# Patient Record
Sex: Male | Born: 1960 | Race: White | Hispanic: No | Marital: Single | Smoking: Former smoker
Health system: Southern US, Community
[De-identification: ages and names within clinical notes are randomized; demographics above are authoritative.]

## PROBLEM LIST (undated history)

## (undated) DIAGNOSIS — R011 Cardiac murmur, unspecified: Secondary | ICD-10-CM

## (undated) DIAGNOSIS — R0600 Dyspnea, unspecified: Secondary | ICD-10-CM

## (undated) DIAGNOSIS — E785 Hyperlipidemia, unspecified: Secondary | ICD-10-CM

## (undated) DIAGNOSIS — I10 Essential (primary) hypertension: Secondary | ICD-10-CM

## (undated) DIAGNOSIS — R0609 Other forms of dyspnea: Secondary | ICD-10-CM

## (undated) DIAGNOSIS — I493 Ventricular premature depolarization: Principal | ICD-10-CM

## (undated) HISTORY — DX: Cardiac murmur, unspecified: R01.1

## (undated) HISTORY — DX: Ventricular premature depolarization: I49.3

## (undated) HISTORY — DX: Essential (primary) hypertension: I10

## (undated) HISTORY — DX: Dyspnea, unspecified: R06.00

## (undated) HISTORY — DX: Other forms of dyspnea: R06.09

## (undated) HISTORY — DX: Hyperlipidemia, unspecified: E78.5

---

## 1999-02-05 ENCOUNTER — Emergency Department (HOSPITAL_COMMUNITY): Admission: EM | Admit: 1999-02-05 | Discharge: 1999-02-06 | Payer: Self-pay | Admitting: Emergency Medicine

## 1999-02-06 ENCOUNTER — Encounter: Payer: Self-pay | Admitting: Emergency Medicine

## 2000-03-12 ENCOUNTER — Encounter: Payer: Self-pay | Admitting: Orthopedic Surgery

## 2000-03-12 ENCOUNTER — Ambulatory Visit (HOSPITAL_COMMUNITY): Admission: RE | Admit: 2000-03-12 | Discharge: 2000-03-12 | Payer: Self-pay | Admitting: Orthopedic Surgery

## 2000-06-08 ENCOUNTER — Emergency Department (HOSPITAL_COMMUNITY): Admission: EM | Admit: 2000-06-08 | Discharge: 2000-06-08 | Payer: Self-pay | Admitting: Emergency Medicine

## 2000-06-10 ENCOUNTER — Emergency Department (HOSPITAL_COMMUNITY): Admission: EM | Admit: 2000-06-10 | Discharge: 2000-06-10 | Payer: Self-pay | Admitting: Emergency Medicine

## 2000-06-22 ENCOUNTER — Ambulatory Visit (HOSPITAL_BASED_OUTPATIENT_CLINIC_OR_DEPARTMENT_OTHER): Admission: RE | Admit: 2000-06-22 | Discharge: 2000-06-22 | Payer: Self-pay | Admitting: Otolaryngology

## 2010-01-03 ENCOUNTER — Encounter: Admission: RE | Admit: 2010-01-03 | Discharge: 2010-01-03 | Payer: Self-pay | Admitting: Family Medicine

## 2010-11-01 NOTE — Op Note (Signed)
[name]  Patient:    Ruben Rose, Ruben Rose                      MRN: [id number] Proc. Date: 06/22/00 Adm. Date:  [id number] Disc. 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  OPERATION PERFORMED: 1. Nasal septoplasty. 2. Submucous resection of inferior turbinates, bilateral.  SURGEON:  Jefry H. Pollyann Kennedy, M.D.  ANESTHESIA:  General endotracheal.  COMPLICATIONS:  None.  ESTIMATED BLOOD LOSS:  50 cc.  FINDINGS:  Severe deviation of the septum in all areas with an S-shaped deformity.  There was a large bony spur inferiorly and anteriorly down the right side, a severe caudal deflection towards the left and a posterior-superior deflection towards the right side.  There was also severe thickening of the maxillary crest with a large spur midway back on the right on the left side.  The inferior turbinates were very large with large bony hypertrophy.  The patient tolerated the procedure well was awakened and extubated and transferred to recovery in stable condition.  INDICATIONS FOR PROCEDURE:  This is a 50 year old with chronic nasal obstruction and loud snoring.  The risks, benefits, alternatives and complications of the procedure were explained to the patient, who seemed to understand and agreed to surgery.  DESCRIPTION OF PROCEDURE:  The patient was taken to the operating room and placed on the operating table in the supine position.  Following induction of general endotracheal anesthesia, the patient was prepped and draped in standard fashion.  Oxymetazoline spray was used preoperatively in the nose. 1% Xylocaine with epinephrine was infiltrated into the septum, the columella, and the inferior turbinates bilaterally.  A total of  approximately 5 cc was used.  Afrin-soaked pledgets were then placed bilaterally in the nasal cavities.  1 - Nasal septoplasty.  A left hemitransfixion incision was used with a 15 scalpel to ____________ the septal cartilage and mucoperichondrial flap was developed posteriorly down the left side.  The bony cartilaginous junction was divided and a flap was developed down right side as well.  A superior attachment of the ethmoid plate was taken down with a fenestrated Jansen-Middleton rongeur.  The posterior attachments which were quite thickened were divided using Therapist, nutritional and a large fragment of ethmoid plate was removed with Lenoria Chime forceps.  A large fragment of vomerian bone was taken down with a closed Jansen-Middleton rongeur.  The caudal septum was elongated and the posterior one third of the quadrangular cartilage was resected.  The caudal most attachment to the maxillary spine was kept intact. The posterior aspect of the maxillary crest was taken down on both sides wtih a 4 mm osteotome.  A mucosal incision was created on the right side as well just in front of a large maxillary crest spur and the mucosa was elevated off the crest and a large fragment of bone was taken down with a chisel.  All of these maneuvers greatly enhanced the ability of the septum to lie flat in the midline and enhanced the nasal airways bilaterally.  The mucosal incision was reapproximated with 4-0 chromic suture.  The septal flaps were quilted with 4-0 plain gut.  There were tears created bilaterally but  the mucosa was nicely reapposed with the quilting suture.  0.030 Silastic sheeting was cut to size and shape and coated with bacitracin ointment and was applied on either side of the septum as well, secured in place with a nylon suture.  2 - Submucous resection of inferior turbinates.  The leading edge of the inferior turbinates was incised vertically with a 15 scalpel.  The mucosa was elevated  off the turbinate bones on all three sides and a large fragment of bone was resected  bilaterally with Lenoria Chime forceps.  The turbinate remnants were outfractured with a Therapist, nutritional.  The nasal cavities were suctioned of blood and secretions and packed with rolled up Telfa gauze coated with Bacitracin ointment.  The pharynx was suctioned under direct visualization as well.  The patient was then awakened, extubated and transferred to recovery. DD:  06/22/00 TD:  06/22/00 Job: [id number]

## 2011-06-20 IMAGING — CR DG CHEST 2V
2 series · 2 of 2 positions shown · non-contrast
Comparison: None

CLINICAL DATA: Chest pain.

CHEST - 2 VIEW

[w chest pa]
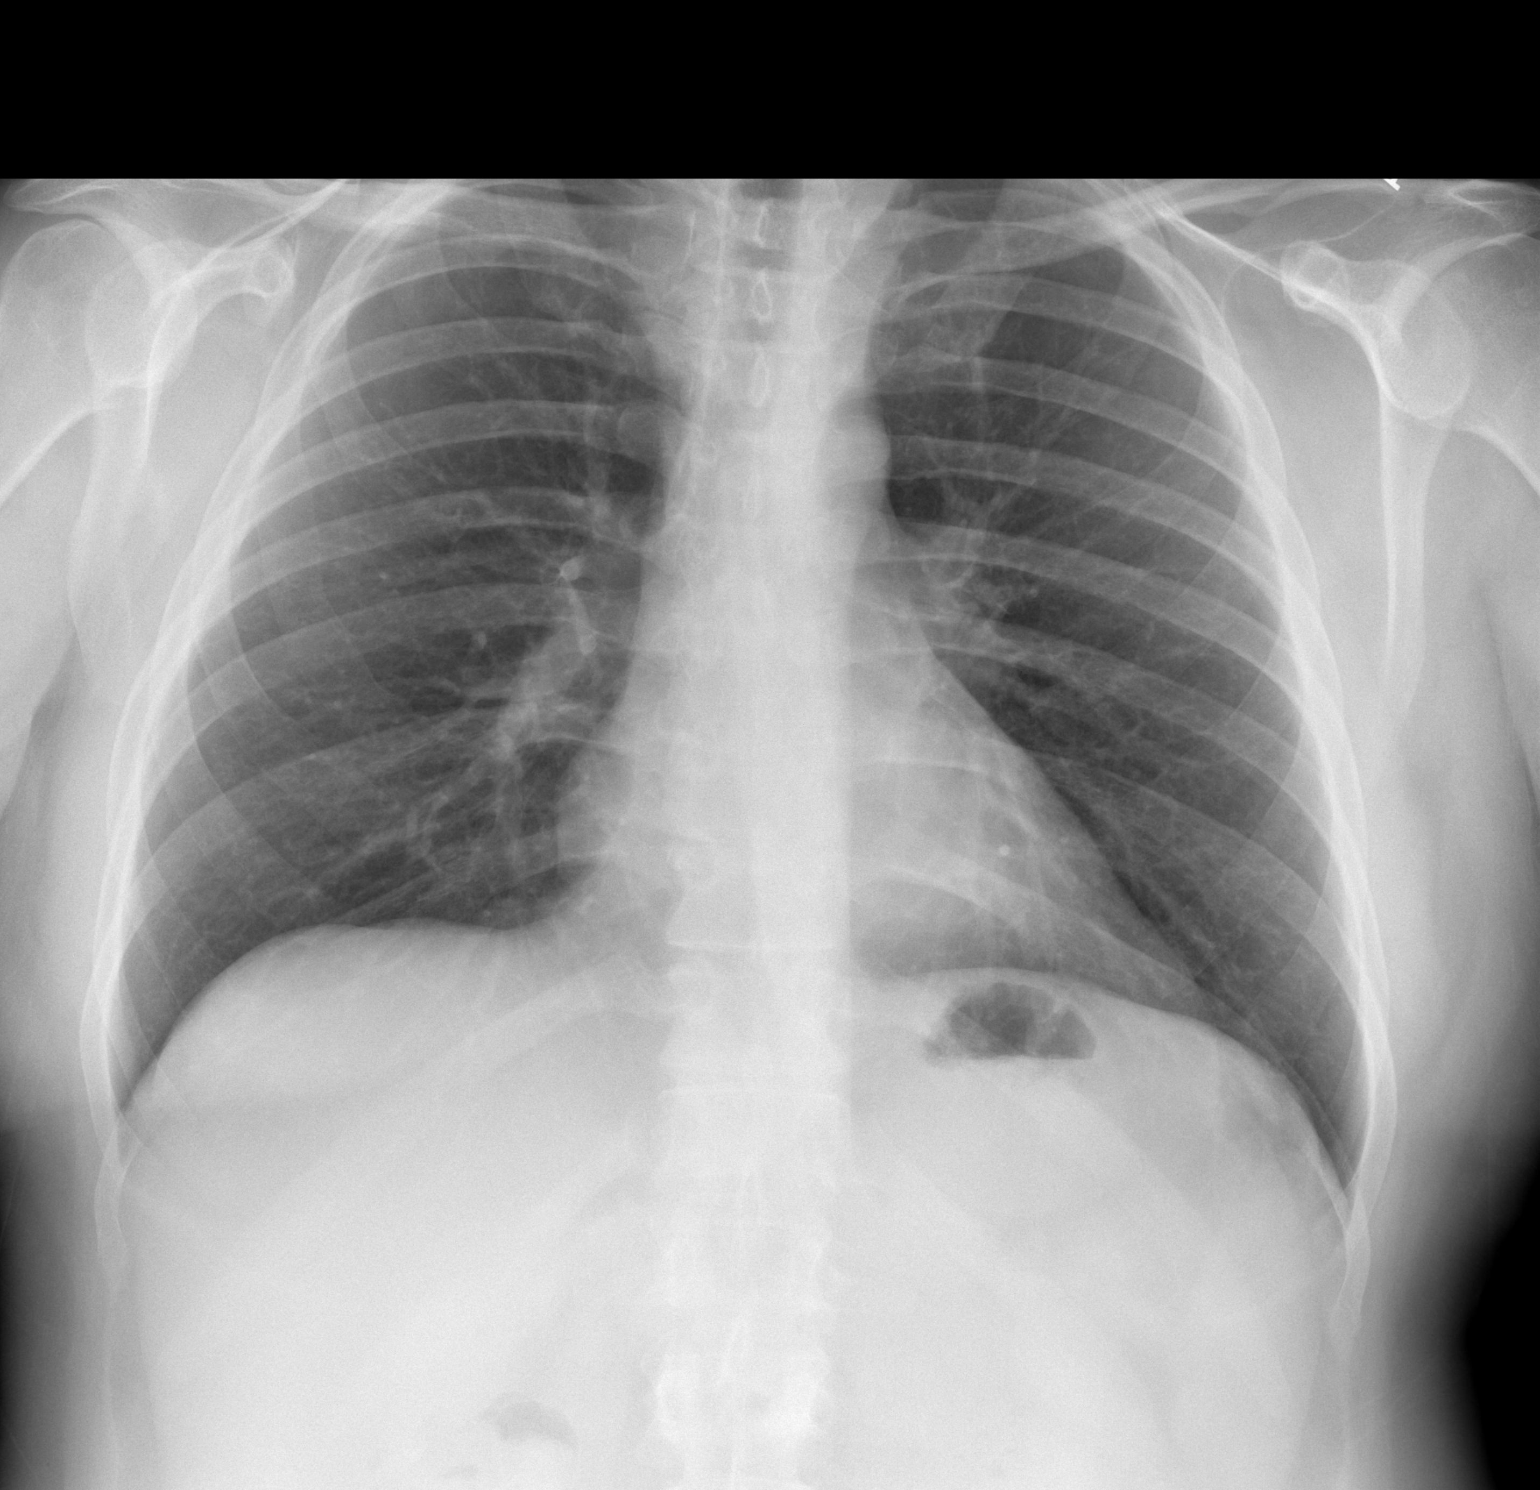

[w chest lat]
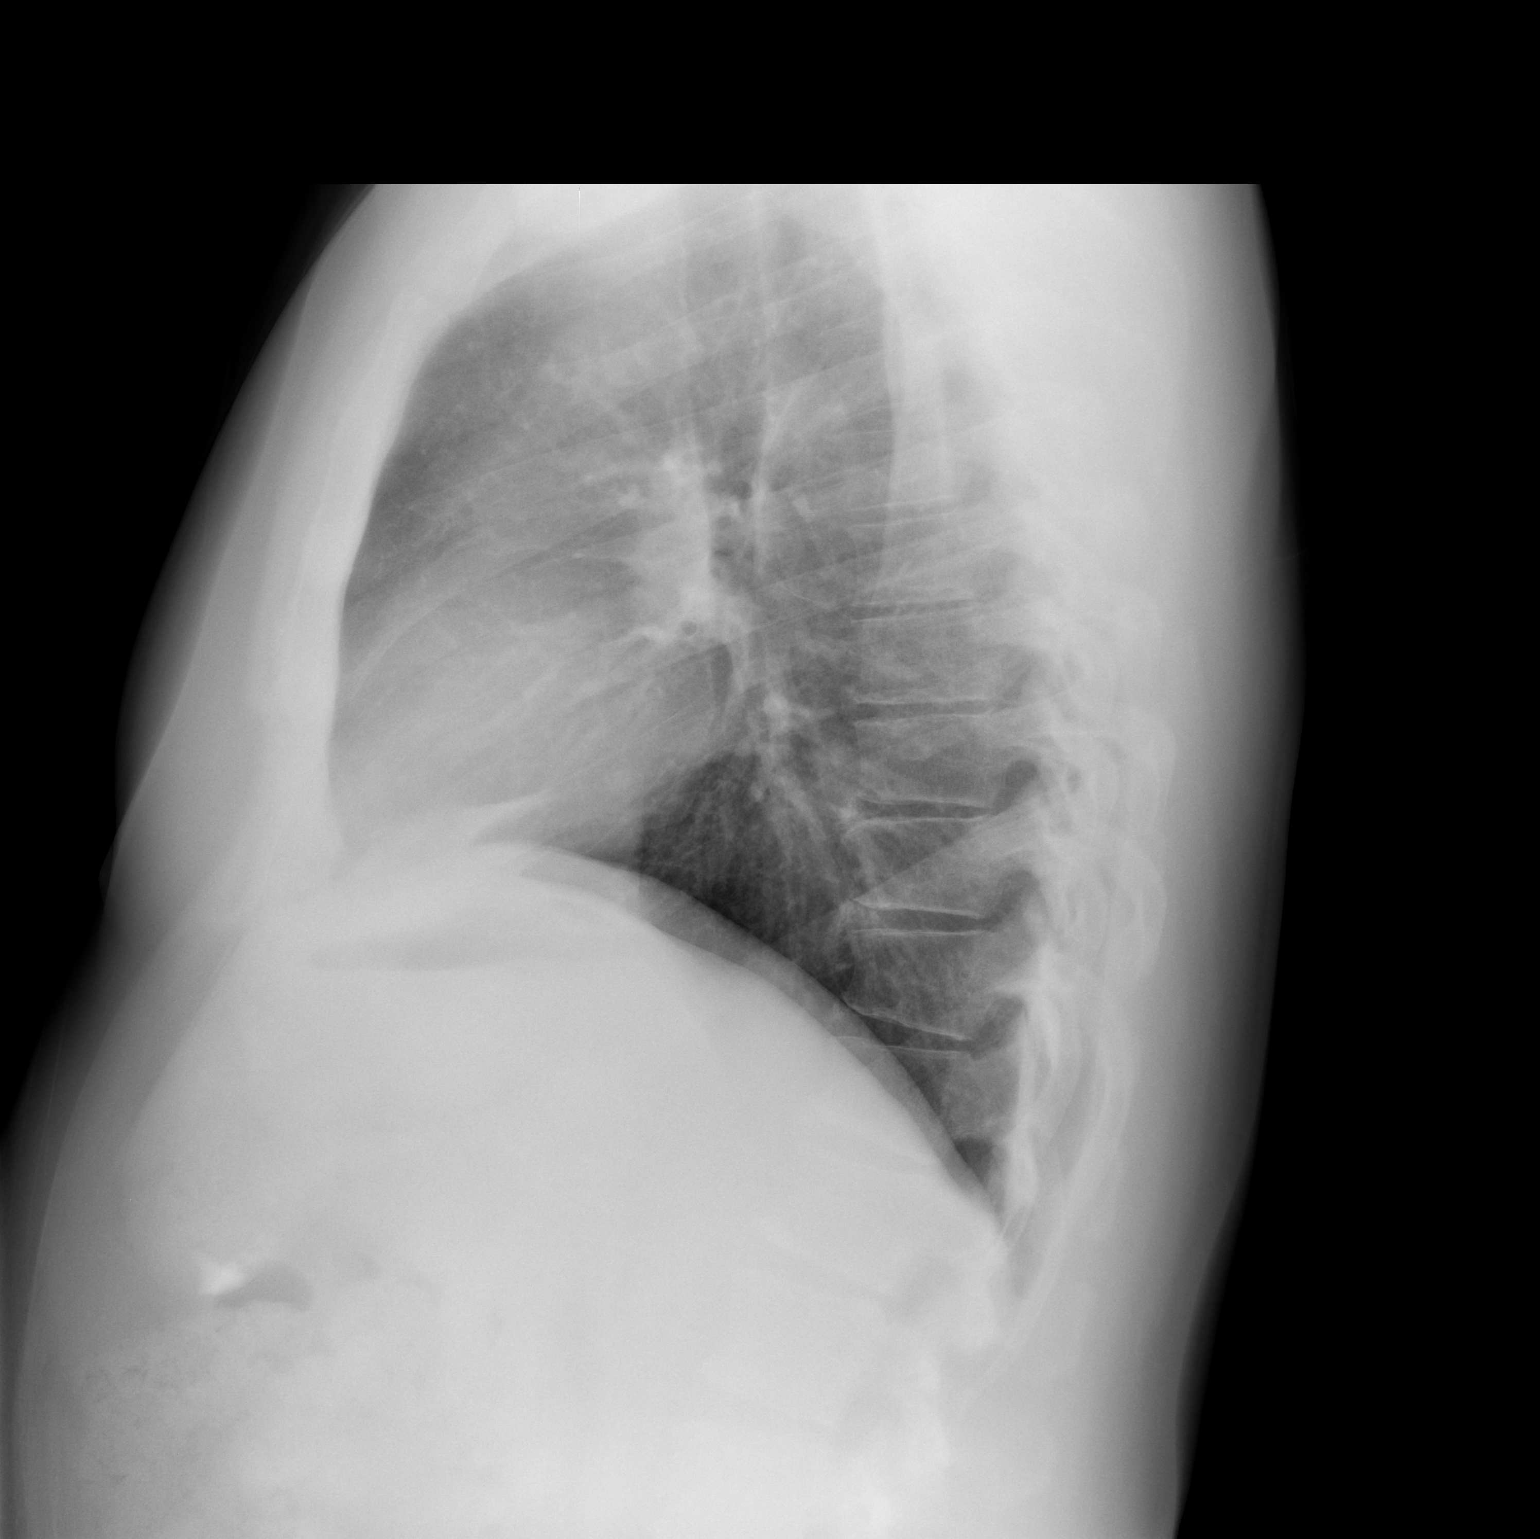

[2 of 2 positions shown; findings below may reference images not displayed]

FINDINGS: Heart and mediastinal contours are within normal limits.
No focal opacities or effusions.  No acute bony abnormality.
IMPRESSION: No active disease.

## 2012-11-30 ENCOUNTER — Encounter: Payer: Self-pay | Admitting: Cardiology

## 2012-12-01 ENCOUNTER — Ambulatory Visit (INDEPENDENT_AMBULATORY_CARE_PROVIDER_SITE_OTHER): Payer: 59 | Admitting: Cardiology

## 2012-12-01 ENCOUNTER — Encounter: Payer: Self-pay | Admitting: Cardiology

## 2012-12-01 VITALS — BP 114/86 | HR 53 | Ht 70.0 in | Wt 220.9 lb

## 2012-12-01 DIAGNOSIS — I499 Cardiac arrhythmia, unspecified: Secondary | ICD-10-CM

## 2012-12-01 DIAGNOSIS — R0600 Dyspnea, unspecified: Secondary | ICD-10-CM

## 2012-12-01 DIAGNOSIS — I459 Conduction disorder, unspecified: Secondary | ICD-10-CM

## 2012-12-01 DIAGNOSIS — R0609 Other forms of dyspnea: Secondary | ICD-10-CM

## 2012-12-01 NOTE — Patient Instructions (Addendum)
These episodes are having sound like they're most likely premature beats that you're not sensing but then as a pause at a future date. This is more likely cause of which is feeling. I am however concerned that he may have a little more significant condition where several beats are blocked and not conducted. This is a little different as far as her long-term evaluation.  What I hope to do is go to capture some these episodes on her event monitor which is a portable ECG that you wear.  Because these are happening with less frequency, we'll have you wear it for a month. I'll then see you back after that is complete. Most likely the treatment will be reassurance that there is an normal phenomenon. However if there is some is not is normal and it is benign, been introduced with the additional evaluation.  [name], MD   Your physician has recommended that you wear an event monitor. Event monitors are medical devices that record the heart's electrical activity. Doctors most often [place] these monitors to diagnose arrhythmias. Arrhythmias are problems with the speed or rhythm of the heartbeat. The monitor is a small, portable device. You can wear one while you do your normal daily activities. This is usually used to diagnose what is causing palpitations/syncope (passing out).   Your physician recommends that you schedule a follow-up appointment in: 6-8 weeks.

## 2012-12-06 ENCOUNTER — Telehealth: Payer: Self-pay | Admitting: *Deleted

## 2012-12-06 ENCOUNTER — Encounter: Payer: Self-pay | Admitting: Cardiology

## 2012-12-06 DIAGNOSIS — R0600 Dyspnea, unspecified: Secondary | ICD-10-CM | POA: Insufficient documentation

## 2012-12-06 DIAGNOSIS — I459 Conduction disorder, unspecified: Secondary | ICD-10-CM | POA: Insufficient documentation

## 2012-12-06 NOTE — Telephone Encounter (Signed)
Call to pt.  Left message to call back.

## 2012-12-06 NOTE — Assessment & Plan Note (Signed)
This is clearly not an anginal equivalent as it doesn't get worse with exertion, and only happens at rest. Completely associated with the heart skipping beats and/palpitations.  I'm curious to correlate his symptoms with the monitor findings.

## 2012-12-06 NOTE — Progress Notes (Signed)
Patient ID: [name], male   DOB: 03-27-1961, 52 y.o.   MRN: [id number]  Clinic Note: HPI: Ruben Rose is a 52 y.o. male with a PMH below who presents today for evaluation of irregular heart beats with shortness of breath.  Ruben Rose was a former patient Dr. Oneta Rack little, last seen in August 2001. This is for evaluation of atypical stabbing chest discomfort evaluated with a Cardiolite Myoview that was negative for ischemia. He also has some anemia on statin. He has hypothyroidism and has recently had his thyroid replacement dose adjusted and that has helped her sleep notably.  Interval History: He presents today with an unusual group of symptoms where he says that he started having Sinemet 2 weeks prior to his visit after coming back from a trip to [facility]. He says that cut out of the blue he started having episodes where he felt his heart stop momentarily were suddenly happens and that takes away his breath and it lasts a few seconds and goes away the comes back. It is often worse when he is trying to sleep. At minute 3-4 times in the course of the night. He doesn't describe it as a discomfort or pain in his chest he describes it as just simply that his heart skips and pauses. He doesn't necessarily note any long-lasting rapid heart rates were regular heart rates. He also denies any shortness of breath unless he has this weird sensation. The symptoms actually get better activity and never seemed to occur with any type of activity. He denies a PND, orthopnea or edema. No exertional chest discomfort or shortness of breath. No lightheadedness or dizziness associated with them, as well as no TIA or amaurosis fugax symptoms.  Past Medical History  Diagnosis Date  . DOE (dyspnea on exertion)   . Hypertension   . Hyperlipidemia   . Dyspnea     NUCLEAR STRESS TEST, 01/29/2010 - EF 65%; Nnegative for ischemia or infarction  . Heart murmur     2D ECHO, 02/06/1999 - 78%, trace MR    History reviewed. No pertinent past surgical history.   No Known Allergies  Current Outpatient Prescriptions  Medication Sig Dispense Refill  . Ascorbic Acid (VITAMIN C) 100 MG tablet Take 100 mg by mouth daily.      Marland Kitchen atorvastatin (LIPITOR) 10 MG tablet Take 10 mg by mouth daily.      Marland Kitchen CALCIUM-VITAMIN D PO Take by mouth daily.      . Glucosamine-Chondroitin-Vit C 2000-1200-60 MG/30ML LIQD Take by mouth.      . Ibuprofen (ADVIL PO) Take by mouth as needed.      Marland Kitchen levothyroxine (SYNTHROID, LEVOTHROID) 100 MCG tablet Take 1 tablet by mouth daily.      . Multiple Vitamin (MULTIVITAMIN) tablet Take 1 tablet by mouth daily.      . vitamin B-12 (CYANOCOBALAMIN) 1000 MCG tablet Take 5,000 mcg by mouth daily.      Marland Kitchen omeprazole (PRILOSEC) 20 MG capsule Take 20 mg by mouth daily.       No current facility-administered medications for this visit.    History   Social History  . Marital Status: Single    Spouse Name: N/A    Number of Children: N/A  . Years of Education: N/A   Occupational History  . Not on file.   Social History Main Topics  . Smoking status: Former Games developer  . Smokeless tobacco: Former Neurosurgeon    Quit date: 12/01/1997  .  Alcohol Use: 0.5 oz/week    1 drink(s) per week  . Drug Use: No  . Sexually Active: Not on file   Other Topics Concern  . Not on file   Social History Narrative  . No narrative on file    ROS: A comprehensive Review of Systems - Negative except Pertinent positives noted above. General ROS: negative for - fatigue, malaise, night sweats or sleep disturbance Gastrointestinal ROS: no abdominal pain, change in bowel habits, or black or bloody stools Genito-Urinary ROS: no dysuria, trouble voiding, or hematuria Neurological ROS: no TIA or stroke symptoms negative for - dizziness, headaches, impaired coordination/balance or visual changes  PHYSICAL EXAM BP 114/86  Pulse 53  Ht 5\' 10"  (1.778 m)  Wt 220 lb 14.4 oz (100.2 kg)  BMI 31.7  kg/m2 General appearance: alert, cooperative, appears stated age, no distress, mildly obese and Pleasant mood and affect, well-nourished and well-groomed. Answers questions properly. Neck: no adenopathy, no carotid bruit, no JVD, supple, symmetrical, trachea midline and thyroid not enlarged, symmetric, no tenderness/mass/nodules Lungs: clear to auscultation bilaterally, normal percussion bilaterally and Nonlabored, good air movement Heart: regular rate and rhythm, S1, S2 normal, no murmur, click, rub or gallop and normal apical impulse Abdomen: soft, non-tender; bowel sounds normal; no masses,  no organomegaly Extremities: extremities normal, atraumatic, no cyanosis or edema, no edema, redness or tenderness in the calves or thighs and no ulcers, gangrene or trophic changes Pulses: 2+ and symmetric Neurologic: Grossly normal HEENT: [place]/AT, EOMI, MMM, anicteric sclera  [id number] today: Yes Rate:53  , Rhythm: Sinus bradycardia, otherwise normal ECG. No significant change.  ASSESSMENT:  otherwise healthy-appearing gentleman with a previously normal cardiac evaluation. He presents with the following symptoms:   Skipped heart beats - Plan: EKG 12-Lead, Cardiac event monitor  Paroxysmal dyspnea -- associated with "skipped heart beats"  PLAN: Per problem list. Orders Placed This Encounter  Procedures  . EKG 12-Lead  . Cardiac event monitor    Standing Status: Future     Number of Occurrences:      Standing Expiration Date: 12/01/2013    Scheduling Instructions:     Instruction and monitor applied. Patient verbalized understanding.     Kit # [id number] ; Model s/n [id number]    Order Specific Question:  Where should this test be performed    Answer:  [facility] [place]    Followup: 6-8  [name], M.D., M.S. [facility] [address] [place], [place]  [address]  [phone] Pager # [phone] 12/06/2012 10:13  PM

## 2012-12-06 NOTE — Telephone Encounter (Signed)
[employer] called wanting to know if pt has had any previous monitoring.  Informed RN is unable to answer that question at the time. Will have to call back.  Verbalized understanding.  Paper chart requested.

## 2012-12-06 NOTE — Telephone Encounter (Signed)
So, I do not recall if he has had monitoring before -- his symptoms are current. My note is in progress.  [name], MD

## 2012-12-06 NOTE — Telephone Encounter (Signed)
Returned call.  Asked pt if he has had any type of monitoring and pt stated he has not.  Will call [employer] to inform.  Call to [employer] and informed no record of previous testing.  Verbalized understanding.

## 2012-12-06 NOTE — Telephone Encounter (Signed)
Returning your call. °

## 2012-12-06 NOTE — Assessment & Plan Note (Signed)
The symptoms seem a lot like he notices his PVCs or PACs. He may have bigeminy or trigeminy pattern however is based on the intensity that they seem to be occurring with. I generally think of benign, his cardiac evaluation has been essentially normal and he does not have any specific activity. He does not drink excessive amounts of caffeine. I once capture with the episodes are with a 30 day monitor. Possibly see him back after monitor is complete.

## 2012-12-30 ENCOUNTER — Encounter: Payer: Self-pay | Admitting: Cardiology

## 2012-12-30 ENCOUNTER — Ambulatory Visit (INDEPENDENT_AMBULATORY_CARE_PROVIDER_SITE_OTHER): Payer: 59 | Admitting: Cardiology

## 2012-12-30 VITALS — BP 122/82 | HR 58 | Ht 69.0 in | Wt 219.2 lb

## 2012-12-30 DIAGNOSIS — R0600 Dyspnea, unspecified: Secondary | ICD-10-CM

## 2012-12-30 DIAGNOSIS — R0609 Other forms of dyspnea: Secondary | ICD-10-CM

## 2012-12-30 DIAGNOSIS — I4949 Other premature depolarization: Secondary | ICD-10-CM

## 2012-12-30 DIAGNOSIS — I493 Ventricular premature depolarization: Secondary | ICD-10-CM

## 2012-12-30 HISTORY — DX: Ventricular premature depolarization: I49.3

## 2012-12-30 NOTE — Progress Notes (Signed)
Patient ID: Ruben Rose, male   DOB: Apr 06, 1961, 52 y.o.   MRN: [id number]  Clinic Note: HPI: Ruben Rose is a 52 y.o. male with a PMH below who presents today for followup evaluation of irregular heart beats with shortness of breath.  Mr. Ruben Rose was a former patient Dr. Oneta Rack little, last seen in August 2011 for evaluation of atypical stabbing chest discomfort evaluated with a Cardiolite Myoview that was negative for ischemia. He also has some anemia on statin. He has hypothyroidism and has recently had his thyroid replacement dose adjusted and that has helped her sleep notably. Return to the office on June 18 with complaints of frequent episodes of is "skipped heartbeats and palpitations "that began after a weekend trip to [facility]. The symptoms were made worse by caffeine or chocolate. There also may worse by stressful situations. He also notes the more when his externally down to sleep.Marland Kitchen He says date when he feels his symptoms he has been catching his breath and went to pause in mid sentence. They continue to occur intermittently throughout the day. Not made worse or better by any particular activity however. His bursts will last anywhere from 2-5 minutes, and then he'll go on, often.Marland Kitchen He has been wearing a [employer] monitor for the last several weeks. This showed mostly sinus rhythm with sinus tachycardia in the 120s to 130s during activity. Is also frequent PVCs noted. There was some bigeminy but no couplets or couplets noted.  He doesn't describe it as a discomfort or pain in his chest he describes it as just simply that his heart skips and pauses. He doesn't necessarily note any long-lasting rapid heart rates were regular heart rates. He also denies any shortness of breath unless he has this weird sensation. The symptoms actually get better activity and never seemed to occur with any type of activity. He denies a PND, orthopnea or edema. No exertional chest discomfort or  shortness of breath. No lightheadedness or dizziness associated with them, as well as no TIA or amaurosis fugax symptoms.  Past Medical History  Diagnosis Date  . DOE (dyspnea on exertion)   . Hypertension   . Hyperlipidemia   . Dyspnea     NUCLEAR STRESS TEST, 01/29/2010 - EF 65%; Nnegative for ischemia or infarction  . Heart murmur     2D ECHO, 02/06/1999 - 78%, trace MR   CardioNet event monitor: June 2014 - mostly sinus rhythm in the 50s to 70s with occasional sinus tachycardia in the 120s to 140s. Multiple episodes of palpitations or documented as PVCs. Occasionally in bigeminy but no trigeminy, couplets or triplets. Otherwise no arrhythmias.  No past surgical history on file.   No Known Allergies  Current Outpatient Prescriptions  Medication Sig Dispense Refill  . Ascorbic Acid (VITAMIN C) 100 MG tablet Take 100 mg by mouth daily.      Marland Kitchen atorvastatin (LIPITOR) 10 MG tablet Take 10 mg by mouth daily.      Marland Kitchen CALCIUM-VITAMIN D PO Take by mouth daily.      . Glucosamine-Chondroitin-Vit C 2000-1200-60 MG/30ML LIQD Take by mouth.      . Ibuprofen (ADVIL PO) Take by mouth as needed.      Marland Kitchen levothyroxine (SYNTHROID, LEVOTHROID) 100 MCG tablet Take 1 tablet by mouth daily.      . Multiple Vitamin (MULTIVITAMIN) tablet Take 1 tablet by mouth daily.      . vitamin B-12 (CYANOCOBALAMIN) 1000 MCG tablet Take 5,000 mcg by mouth  daily.       No current facility-administered medications for this visit.    History   Social History  . Marital Status: Single    Spouse Name: N/A    Number of Children: N/A  . Years of Education: N/A   Occupational History  . Not on file.   Social History Main Topics  . Smoking status: Former Games developer  . Smokeless tobacco: Former Neurosurgeon    Quit date: 12/01/1997  . Alcohol Use: 0.5 oz/week    1 drink(s) per week  . Drug Use: No  . Sexually Active: Not on file   Other Topics Concern  . Not on file   Social History Narrative  . No narrative on file     ROS: A comprehensive Review of Systems - Negative except Pertinent positives noted above. Neurological ROS: no TIA or stroke symptoms negative for - bowel and bladder control changes, dizziness, headaches, impaired coordination/balance, numbness/tingling, seizures, visual changes or weakness  PHYSICAL EXAM BP 122/82  Pulse 58  Ht 5\' 9"  (1.753 m)  Wt 219 lb 3.2 oz (99.428 kg)  BMI 32.36 kg/m2 General appearance: alert, cooperative, appears stated age, no distress, mildly obese and Pleasant mood and affect, well-nourished and well-groomed. Answers questions properly. Neck: no adenopathy, no carotid bruit, no JVD, supple, symmetrical, trachea midline and thyroid not enlarged, symmetric, no tenderness/mass/nodules Lungs: clear to auscultation bilaterally, normal percussion bilaterally and Nonlabored, good air movement Heart: regular rate and rhythm, S1, S2 normal, no murmur, click, rub or gallop and normal apical impulse Abdomen: soft, non-tender; bowel sounds normal; no masses,  no organomegaly HEENT: [place]/AT, EOMI, MMM, anicteric sclera  [id number] today: No   ASSESSMENT / PLAN:  otherwise healthy-appearing gentleman with a previously normal cardiac evaluation. He presents with the following symptoms:   Frequent unifocal PVCs - benign, symptomatic  Paroxysmal dyspnea -- associated with "skipped heart beats"   Frequent unifocal PVCs - benign, symptomatic We talked about these symptoms. With his previous evaluations for coronary ischemia being negative. And no structural of the malleus was heart. PVCs and of themselves but are not noted during tachycardia are likely to be benign. We talked about potential treatments with beta blocker versus simply reassurance. He is not overly inclined to take a daily medication for symptoms that are of a benign etiology. I think that overall his symptoms become worse we be happy to give her a prescription for when necessary beta blocker however for  now he is fine with observant therapy.  Paroxysmal dyspnea -- associated with "skipped heart beats" He again confirmed to me that these difficulty breathing episodes are always associated with this PVCs. He denies any exertional dyspnea or angina to suggest anything other than symptomatic PVCs.    Followup: One year.  [name], M.D., M.S. [facility] [address] [place], [place]  [address]  [phone] Pager # [phone] 12/30/2012 4:47 PM

## 2012-12-30 NOTE — Assessment & Plan Note (Signed)
We talked about these symptoms. With his previous evaluations for coronary ischemia being negative. And no structural of the malleus was heart. PVCs and of themselves but are not noted during tachycardia are likely to be benign. We talked about potential treatments with beta blocker versus simply reassurance. He is not overly inclined to take a daily medication for symptoms that are of a benign etiology. I think that overall his symptoms become worse we be happy to give her a prescription for when necessary beta blocker however for now he is fine with observant therapy.

## 2012-12-30 NOTE — Assessment & Plan Note (Signed)
He again confirmed to me that these difficulty breathing episodes are always associated with this PVCs. He denies any exertional dyspnea or angina to suggest anything other than symptomatic PVCs.

## 2012-12-30 NOTE — Patient Instructions (Addendum)
Monitor showed begnin PVC . At present the  Time, Dr Herbie Baltimore does not want to treat the PVC'S.  IF they become an issue for you, please contact the office. Continue your current medication.  Your physician wants you to follow-up in 12 months with Dr Herbie Baltimore.  You will receive a reminder letter in the mail two months in advance. If you don't receive a letter, please call our office to schedule the follow-up appointment.

## 2013-01-10 ENCOUNTER — Telehealth: Payer: Self-pay | Admitting: Cardiology

## 2013-01-10 MED ORDER — METOPROLOL TARTRATE 25 MG PO TABS: ORAL_TABLET | ORAL | Status: AC

## 2013-01-10 NOTE — Telephone Encounter (Signed)
Wife states that they are on vacation and  Ruben Rose has been having skipped beats since yesterday,he wants to start the medication you and he discuss at Last appointment  Awaiting for Dr Herbie Baltimore to refer

## 2013-01-10 NOTE — Telephone Encounter (Signed)
12.5 mg bid lopressor  Prn  [name], MD

## 2013-01-10 NOTE — Telephone Encounter (Signed)
Ruben Rose is was wearing a heart monitor , and he now is in AFIB and Dr.Harding said that he would prescribe a medication for him to help with that .Marland Kitchen Would like for you to call it in to the [facility] in [place] .Marland Kitchen Telephone Number is [phone].Marland Kitchen    Thanks

## 2013-01-10 NOTE — Telephone Encounter (Signed)
Per Dr Herbie Baltimore , start Metoprolol tart  12.5 mg twice a day as needed for palp/skipped beats   Rx sent to [facility];   notified wife ,Fannie Knee .  Voiced understanding.

## 2013-01-10 NOTE — Telephone Encounter (Signed)
Wife calling again-he need  his medicine-Do you think it will be called in today?they are are at the pharmacy now.

## 2013-12-26 ENCOUNTER — Encounter: Payer: Self-pay | Admitting: Cardiology

## 2013-12-26 ENCOUNTER — Ambulatory Visit (INDEPENDENT_AMBULATORY_CARE_PROVIDER_SITE_OTHER): Payer: 59 | Admitting: Cardiology

## 2013-12-26 VITALS — BP 124/88 | HR 55 | Ht 69.0 in | Wt 231.0 lb

## 2013-12-26 DIAGNOSIS — E669 Obesity, unspecified: Secondary | ICD-10-CM

## 2013-12-26 DIAGNOSIS — R42 Dizziness and giddiness: Secondary | ICD-10-CM

## 2013-12-26 DIAGNOSIS — I493 Ventricular premature depolarization: Secondary | ICD-10-CM

## 2013-12-26 DIAGNOSIS — I4949 Other premature depolarization: Secondary | ICD-10-CM

## 2013-12-26 NOTE — Patient Instructions (Signed)
Your physician recommends that you schedule a follow-up appointment  ON AS NEEDED BASIS  CONTINUE TO USE beta blocker (Metopropol) as needed

## 2013-12-28 DIAGNOSIS — R42 Dizziness and giddiness: Secondary | ICD-10-CM | POA: Insufficient documentation

## 2013-12-28 DIAGNOSIS — E669 Obesity, unspecified: Secondary | ICD-10-CM | POA: Insufficient documentation

## 2013-12-28 NOTE — Assessment & Plan Note (Signed)
Recommended dietary modification and exercise.

## 2013-12-28 NOTE — Assessment & Plan Note (Signed)
Benign PVCs. Much less notable for him now. He may no longer require when necessary beta blocker. His resting heart rates as low so would not keep him on the same dose. I explained to him that these episodes tend to come in spells. Hopefully there will be any more, but if they do come he can try using the metoprolol again.

## 2013-12-28 NOTE — Progress Notes (Signed)
Patient ID: Ruben Rose, male   DOB: May 28, 1961, 53 y.o.   MRN: [id number] PCP: Ruben Bussing, MD  Clinic Note:   Chief Complaint:  Chief Complaint  Patient presents with  . Annual Exam    occasional lightheadedness/dizziness with position changes    HPI: Ruben Rose is a 53 y.o. male with a PMH below who presents today for one year followup evaluation of irregular heart beats with shortness of breath.  Ruben Rose was a former patient Dr. Caprice Kluver who had a Cardiolite/Myoview stress test to evaluate atypical chest pain. This was negative for ischemia. He then came back to see me in the summer of 2014 with complaints of frequent episodes of is "skipped heartbeats and palpitations "that began after a weekend trip to [facility]. The symptoms were made worse by caffeine or chocolate. There also may worse by stressful situations. He also notes the more when his externally down to sleep..   CardioNet monitorshowed mostly sinus rhythm with sinus tachycardia in the 120s to 130s during activity. Is also frequent PVCs noted. There was some bigeminy but no couplets or couplets noted. he was prescribed when necessary metoprolol.   Interval History: Over the past year, he may need to 1/2-2 metoprolol pills a, stating that he really hasn't had much the way of symptoms since the first time he took one.  He really has not noticed any new symptoms since the side mainly a few "flip-flops and there ". He is denying any chest tightness or pressure or dyspnea at rest or exertion. Again no rapid or irregular heartbeats. No syncope or near syncope, TIA or amaurosis fugax symptoms. He does have periodic positional lightheadedness and dizziness, especially if he is not adequately hydrated. He denies a PND, orthopnea or edema.   Past Medical History  Diagnosis Date  . DOE (dyspnea on exertion)     NUCLEAR STRESS TEST, 01/29/2010 - EF 65%; Nnegative for ischemia or infarction  . Hypertension   .  Hyperlipidemia   . Heart murmur     2D ECHO, 02/06/1999 - 78%, trace MR  . Frequent unifocal PVCs - benign, symptomatic 12/30/2012    CardioNet event monitor: Sinus bradycardia/sinus rhythm with occasional sinus tachycardia 120s to 140s. Frequent PVCs occasionally in bigeminy. No couplets or triplets.   CardioNet event monitor: June 2014 - mostly sinus rhythm in the 50s to 70s with occasional sinus tachycardia in the 120s to 140s. Multiple episodes of palpitations or documented as PVCs. Occasionally in bigeminy but no trigeminy, couplets or triplets. Otherwise no arrhythmias.  No past surgical history on file.  No Known Allergies   PHYSICAL EXAM BP 124/88  Pulse 55  Ht 5\' 9"  (1.753 m)  Wt 231 lb (104.781 kg)  BMI 34.10 kg/m2 General appearance: alert, cooperative, appears stated age, no distress, mildly obese and Pleasant mood and affect, well-nourished and well-groomed. Answers questions properly. Neck: no adenopathy, no carotid bruit, no JVD, supple, symmetrical, trachea midline and thyroid not enlarged, symmetric, no tenderness/mass/nodules Lungs: clear to auscultation bilaterally, normal percussion bilaterally and Nonlabored, good air movement Heart: regular rate and rhythm, S1, S2 normal, no murmur, click, rub or gallop and normal apical impulse Abdomen: soft, non-tender; bowel sounds normal; no masses,  no organomegaly HEENT: /AT, EOMI, MMM, anicteric sclera  WUJ:[id number] today: Yes ; sinus bradycardia, rate 55 bpm. Otherwise normal EKG.   ASSESSMENT / PLAN: Otherwise healthy middle-aged gentleman with no significant cardiac symptoms. He is not having to take any  of the when necessary metoprolol for his symptomatic palpitations. He may just not been noticing them anymore. He is fine to continue to use when necessary metoprolol, I suspect that he may not have spells for quite some time. As he is not having active according symptoms, and he finally returned with PCP. He is more local  to return here if there are recurrence of symptoms that would again appear cardiac in nature.  Frequent unifocal PVCs - Plan: EKG 12-Lead  Obesity (BMI 30-39.9)  Postural dizziness   Frequent unifocal PVCs - benign, symptomatic Benign PVCs. Much less notable for him now. He may no longer require when necessary beta blocker. His resting heart rates as low so would not keep him on the same dose. I explained to him that these episodes tend to come in spells. Hopefully there will be any more, but if they do come he can try using the metoprolol again.  Obesity (BMI 30-39.9) Recommended dietary modification and exercise.  Postural dizziness Discussed importance of staying adequately hydrated and avoid rapid changes in position. Making sure that he has adequate drink in the morning before getting up for the day   Followup:  WHEN NECESSARY; return to PCP  Ruben Rose,Ruben Rose, M.D., M.S. Interventional Cardiologist   Pager # [phone]

## 2013-12-28 NOTE — Assessment & Plan Note (Signed)
Discussed importance of staying adequately hydrated and avoid rapid changes in position. Making sure that he has adequate drink in the morning before getting up for the day

## 2017-04-07 DIAGNOSIS — E039 Hypothyroidism, unspecified: Secondary | ICD-10-CM | POA: Diagnosis not present

## 2017-04-07 DIAGNOSIS — Z0000 Encounter for general adult medical examination without abnormal findings: Secondary | ICD-10-CM | POA: Diagnosis not present

## 2017-04-07 DIAGNOSIS — E7800 Pure hypercholesterolemia, unspecified: Secondary | ICD-10-CM | POA: Diagnosis not present

## 2017-04-07 DIAGNOSIS — D81818 Other biotin-dependent carboxylase deficiency: Secondary | ICD-10-CM | POA: Diagnosis not present

## 2017-04-07 DIAGNOSIS — Z125 Encounter for screening for malignant neoplasm of prostate: Secondary | ICD-10-CM | POA: Diagnosis not present

## 2018-04-05 DIAGNOSIS — E7800 Pure hypercholesterolemia, unspecified: Secondary | ICD-10-CM | POA: Diagnosis not present

## 2018-05-03 DIAGNOSIS — Z0000 Encounter for general adult medical examination without abnormal findings: Secondary | ICD-10-CM | POA: Diagnosis not present

## 2018-05-03 DIAGNOSIS — Z23 Encounter for immunization: Secondary | ICD-10-CM | POA: Diagnosis not present

## 2018-05-04 DIAGNOSIS — Z0000 Encounter for general adult medical examination without abnormal findings: Secondary | ICD-10-CM | POA: Diagnosis not present

## 2018-05-04 DIAGNOSIS — Z125 Encounter for screening for malignant neoplasm of prostate: Secondary | ICD-10-CM | POA: Diagnosis not present

## 2018-05-04 DIAGNOSIS — E7800 Pure hypercholesterolemia, unspecified: Secondary | ICD-10-CM | POA: Diagnosis not present

## 2018-07-06 DIAGNOSIS — Z23 Encounter for immunization: Secondary | ICD-10-CM | POA: Diagnosis not present

## 2018-07-20 DIAGNOSIS — M659 Synovitis and tenosynovitis, unspecified: Secondary | ICD-10-CM | POA: Diagnosis not present

## 2018-07-20 DIAGNOSIS — M19071 Primary osteoarthritis, right ankle and foot: Secondary | ICD-10-CM | POA: Diagnosis not present

## 2018-07-20 DIAGNOSIS — M109 Gout, unspecified: Secondary | ICD-10-CM | POA: Diagnosis not present

## 2018-07-20 DIAGNOSIS — M67371 Transient synovitis, right ankle and foot: Secondary | ICD-10-CM | POA: Diagnosis not present

## 2018-07-22 DIAGNOSIS — M109 Gout, unspecified: Secondary | ICD-10-CM | POA: Diagnosis not present

## 2018-08-09 DIAGNOSIS — E039 Hypothyroidism, unspecified: Secondary | ICD-10-CM | POA: Diagnosis not present
